# Patient Record
Sex: Male | Born: 1995 | Race: White | Hispanic: No | Marital: Single | Smoking: Never smoker
Health system: Southern US, Community
[De-identification: ages and names within clinical notes are randomized; demographics above are authoritative.]

## PROBLEM LIST (undated history)

## (undated) VITALS — BP 102/66 | HR 77 | Temp 97.1°F | Resp 18 | Wt 154.3 lb

## (undated) DIAGNOSIS — B2790 Infectious mononucleosis, unspecified without complication: Secondary | ICD-10-CM

## (undated) DIAGNOSIS — F419 Anxiety disorder, unspecified: Secondary | ICD-10-CM

## (undated) DIAGNOSIS — S42309A Unspecified fracture of shaft of humerus, unspecified arm, initial encounter for closed fracture: Secondary | ICD-10-CM

## (undated) HISTORY — DX: Infectious mononucleosis, unspecified without complication: B27.90

## (undated) HISTORY — PX: TONSILLECTOMY: SUR1361

## (undated) HISTORY — PX: ADENOIDECTOMY: SUR15

## (undated) HISTORY — DX: Unspecified fracture of shaft of humerus, unspecified arm, initial encounter for closed fracture: S42.309A

## (undated) HISTORY — PX: ORTHOPEDIC SURGERY: SHX850

---
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
## 2016-10-29 ENCOUNTER — Other Ambulatory Visit (INDEPENDENT_AMBULATORY_CARE_PROVIDER_SITE_OTHER): Payer: Self-pay | Admitting: Orthopaedic Surgery

## 2016-10-29 MED ORDER — IBUPROFEN-FAMOTIDINE 800-26.6 MG PO TABS: 1.0000 | ORAL_TABLET | Freq: Three times a day (TID) | ORAL | 3 refills | Status: AC | PRN

## 2016-10-29 MED ORDER — CYCLOBENZAPRINE HCL 5 MG PO TABS: 5.0000 mg | ORAL_TABLET | Freq: Three times a day (TID) | ORAL | 3 refills | Status: AC | PRN

## 2018-11-19 ENCOUNTER — Encounter (HOSPITAL_COMMUNITY): Payer: Self-pay | Admitting: Emergency Medicine

## 2018-11-19 ENCOUNTER — Emergency Department (HOSPITAL_COMMUNITY)
Admission: EM | Admit: 2018-11-19 | Discharge: 2018-11-20 | Disposition: A | Discharge status: Home / Self Care | Payer: BLUE CROSS/BLUE SHIELD | Attending: Emergency Medicine | Admitting: Emergency Medicine

## 2018-11-19 ENCOUNTER — Emergency Department (HOSPITAL_COMMUNITY): Payer: BLUE CROSS/BLUE SHIELD

## 2018-11-19 ENCOUNTER — Other Ambulatory Visit: Payer: Self-pay

## 2018-11-19 DIAGNOSIS — R4182 Altered mental status, unspecified: Secondary | ICD-10-CM | POA: Diagnosis not present

## 2018-11-19 DIAGNOSIS — F333 Major depressive disorder, recurrent, severe with psychotic symptoms: Secondary | ICD-10-CM | POA: Diagnosis not present

## 2018-11-19 DIAGNOSIS — Z23 Encounter for immunization: Secondary | ICD-10-CM | POA: Insufficient documentation

## 2018-11-19 DIAGNOSIS — S91311A Laceration without foreign body, right foot, initial encounter: Secondary | ICD-10-CM | POA: Insufficient documentation

## 2018-11-19 DIAGNOSIS — Y9389 Activity, other specified: Secondary | ICD-10-CM | POA: Insufficient documentation

## 2018-11-19 DIAGNOSIS — F332 Major depressive disorder, recurrent severe without psychotic features: Secondary | ICD-10-CM | POA: Diagnosis not present

## 2018-11-19 DIAGNOSIS — F1920 Other psychoactive substance dependence, uncomplicated: Secondary | ICD-10-CM

## 2018-11-19 DIAGNOSIS — Z79899 Other long term (current) drug therapy: Secondary | ICD-10-CM | POA: Diagnosis not present

## 2018-11-19 DIAGNOSIS — Y92009 Unspecified place in unspecified non-institutional (private) residence as the place of occurrence of the external cause: Secondary | ICD-10-CM | POA: Insufficient documentation

## 2018-11-19 DIAGNOSIS — R45851 Suicidal ideations: Secondary | ICD-10-CM | POA: Diagnosis not present

## 2018-11-19 DIAGNOSIS — F23 Brief psychotic disorder: Secondary | ICD-10-CM

## 2018-11-19 DIAGNOSIS — W25XXXA Contact with sharp glass, initial encounter: Secondary | ICD-10-CM | POA: Insufficient documentation

## 2018-11-19 DIAGNOSIS — F1410 Cocaine abuse, uncomplicated: Secondary | ICD-10-CM

## 2018-11-19 DIAGNOSIS — Y999 Unspecified external cause status: Secondary | ICD-10-CM | POA: Diagnosis not present

## 2018-11-19 HISTORY — DX: Anxiety disorder, unspecified: F41.9

## 2018-11-19 LAB — ACETAMINOPHEN LEVEL: Acetaminophen (Tylenol), Serum: <10 ug/mL — ABNORMAL LOW (ref 10–30)

## 2018-11-19 LAB — CBC
HCT: 42.2 % (ref 39.0–52.0)
HEMOGLOBIN: 14.1 g/dL (ref 13.0–17.0)
MCH: 31.1 pg (ref 26.0–34.0)
MCHC: 33.4 g/dL (ref 30.0–36.0)
MCV: 93 fL (ref 80.0–100.0)
Platelets: 268 10*3/uL (ref 150–400)
RBC: 4.54 MIL/uL (ref 4.22–5.81)
RDW: 13.1 % (ref 11.5–15.5)
WBC: 7.5 10*3/uL (ref 4.0–10.5)
nRBC: 0 % (ref 0.0–0.2)

## 2018-11-19 LAB — COMPREHENSIVE METABOLIC PANEL
ALBUMIN: 4.5 g/dL (ref 3.5–5.0)
ALK PHOS: 55 U/L (ref 38–126)
ALT: 19 U/L (ref 0–44)
AST: 23 U/L (ref 15–41)
Anion gap: 10 (ref 5–15)
BUN: 15 mg/dL (ref 6–20)
CALCIUM: 8.6 mg/dL — AB (ref 8.9–10.3)
CO2: 22 mmol/L (ref 22–32)
CREATININE: 1.06 mg/dL (ref 0.61–1.24)
Chloride: 104 mmol/L (ref 98–111)
GFR calc non Af Amer: >60 mL/min (ref >60)
GLUCOSE: 103 mg/dL — AB (ref 70–99)
Potassium: 3.7 mmol/L (ref 3.5–5.1)
SODIUM: 136 mmol/L (ref 135–145)
Total Bilirubin: 1 mg/dL (ref 0.3–1.2)
Total Protein: 7 g/dL (ref 6.5–8.1)

## 2018-11-19 LAB — RAPID URINE DRUG SCREEN, HOSP PERFORMED
AMPHETAMINES: NOT DETECTED
BENZODIAZEPINES: POSITIVE — AB
Barbiturates: NOT DETECTED
Cocaine: POSITIVE — AB
Opiates: NOT DETECTED
TETRAHYDROCANNABINOL: POSITIVE — AB

## 2018-11-19 LAB — ETHANOL: Alcohol, Ethyl (B): <10 mg/dL (ref <10)

## 2018-11-19 LAB — SALICYLATE LEVEL: Salicylate Lvl: <7 mg/dL (ref 2.8–30.0)

## 2018-11-19 MED ORDER — BUPROPION HCL ER (XL) 150 MG PO TB24
150.0000 mg | ORAL_TABLET | Freq: Every day | ORAL | Status: DC
  Administered 2018-11-19 – 2018-11-20 (×2): 150 mg via ORAL
  Filled 2018-11-19 (×2): qty 1

## 2018-11-19 MED ORDER — PAROXETINE HCL 20 MG PO TABS
40.0000 mg | ORAL_TABLET | ORAL | Status: DC
  Administered 2018-11-20: 40 mg via ORAL
  Filled 2018-11-19: qty 2

## 2018-11-19 MED ORDER — ACETAMINOPHEN 325 MG PO TABS
650.0000 mg | ORAL_TABLET | Freq: Once | ORAL | Status: AC
  Administered 2018-11-19: 650 mg via ORAL
  Filled 2018-11-19: qty 2

## 2018-11-19 MED ORDER — HYDROXYZINE HCL 25 MG PO TABS
25.0000 mg | ORAL_TABLET | Freq: Every day | ORAL | Status: DC
  Administered 2018-11-19 – 2018-11-20 (×2): 25 mg via ORAL
  Filled 2018-11-19 (×2): qty 1

## 2018-11-19 MED ORDER — LORAZEPAM 1 MG PO TABS
1.0000 mg | ORAL_TABLET | Freq: Every day | ORAL | Status: DC
  Administered 2018-11-19 – 2018-11-20 (×2): 1 mg via ORAL
  Filled 2018-11-19 (×2): qty 1

## 2018-11-19 MED ORDER — TETANUS-DIPHTH-ACELL PERTUSSIS 5-2.5-18.5 LF-MCG/0.5 IM SUSP
0.5000 mL | Freq: Once | INTRAMUSCULAR | Status: AC
  Administered 2018-11-19: 0.5 mL via INTRAMUSCULAR
  Filled 2018-11-19 (×2): qty 0.5

## 2018-11-19 NOTE — ED Notes (Signed)
Pts foot wounds cleaned with sterile saline and gauze.

## 2018-11-19 NOTE — ED Notes (Signed)
PT have been made aware of urine sample. Unable to provide at this time 

## 2018-11-19 NOTE — BH Assessment (Addendum)
Assessment Note  Jaime Mann. is an 23 y.o. male that presents this date with S/I after attempting self harm by electrocution. Patient was combative on admission and noted to be impaired. Patient was medicated on arrival as this writer attempted to assess earlier this date unsuccessfully. Patient was assessed later at 1600 hours and is noted to continued to be impaired. Patient is oriented x4 although continues to be very disorganized. Patient is observed to be acting bizarre and answers only certain questions on the assessment. Patient states "I'll pass" on certain questions and renders limited information in reference to the incident that occurred last night. Per notes patient reported on admission that he attempted to put a electrical appliance (hair dryer) in the bathtub with him. When questioned this date patient states "maybe I did, maybe I didn't." Patient denies any previous attempts or gestures at self harm although per note review was seen in 2013 at [facility] for attempting self harm by carbon monoxide poisoning. Patient was diagnosed with MDD per that event. Patient denies any symptoms this date or current medication interventions. Patient renders conflicting information stating he has "never seen a outpatient doctor" and then later states he "has been on medications for years." Patient is observed to be acting very childlike and is drawing pictures in the air with his finger. Patient is speaking in a high voice that is rapid and pressured at times. Patient does not appear to be responding to internal stimuli. Patient denies any SA use although UDS is positive for Cannabis and Cocaine. Patient declines to participate in the latter part of the assessment and is noted to cover his head up in bed with his covers. Information to complete assessment was obtained form history and admission notes. Per notes, patient presents by EMS with suicidal ideation with plan to handle electric appliance while in bath.  Given Haldol 5mg  and Midazolam 5 mg IM for anxiety and combative behavior on arrival. Patient was noted to have kicked out a window at his home. Patient stated on arrival "you fixed my foot, but you can't fix my head." Case was staffed with Arville Care FNP who recommended patient be observed and monitored. Patient will be seen by psychiatry in the a.m.    Diagnosis: F33.3 MDD recurrent with psychotic features, severe, Cannabis use, Cocaine use    Past Medical History:  Past Medical History:  Diagnosis Date  . Anxiety disorder   . Broken arm 2012  . Mononucleosis 5th, 8th and 9th grades    Past Surgical History:  Procedure Laterality Date  . ADENOIDECTOMY    . ORTHOPEDIC SURGERY  Age 60  . TONSILLECTOMY  Age 63    Family History:  Family History  Problem Relation Age of Onset  . Cancer Father   . Depression Father   . Alcohol abuse Paternal Grandmother   . Depression Paternal Grandmother   . Drug abuse Paternal Uncle        recovered     Social History:  reports that he has never smoked. He has never used smokeless tobacco. He reports that he does not drink alcohol or use drugs.  Additional Social History:  Alcohol / Drug Use Pain Medications: See MAR Prescriptions: See MAR Over the Counter: See MAR History of alcohol / drug use?: Yes Longest period of sobriety (when/how long): NA Negative Consequences of Use: (Denies) Withdrawal Symptoms: (Denies) Substance #1 Name of Substance 1: Cocaine per hx 1 - Age of First Use: UTA 1 - Amount (size/oz):  UTA 1 - Frequency: UTA 1 - Duration: UTA 1 - Last Use / Amount: Per UDS this date  CIWA: CIWA-Ar BP: (!) 97/56 Pulse Rate: 70 COWS:    Allergies:  Allergies  Allergen Reactions  . Fish-Derived Products Other (See Comments)    Eye swelling, very bad acid relux  . Shellfish Allergy Nausea And Vomiting and Swelling    Home Medications: (Not in a hospital admission)   OB/GYN Status:  No LMP for male patient.  General  Assessment Data Assessment unable to be completed: Yes Reason for not completing assessment: Pt was medicated on arrival currently asleep  Location of Assessment: [facility] TTS Assessment: In system Is this a Tele or Face-to-Face Assessment?: Face-to-Face Is this an Initial Assessment or a Re-assessment for this encounter?: Initial Assessment Patient Accompanied by:: (NA) Language Other than English: No Living Arrangements: Other (Comment)(Roomates) What gender do you identify as?: Male Marital status: Single Living Arrangements: Non-relatives/Friends Can pt return to current living arrangement?: Yes Admission Status: Voluntary Is patient capable of signing voluntary admission?: Yes Referral Source: Self/Family/Friend Insurance type: BC/BS  Medical Screening Exam [facility] Walk-in ONLY) Medical Exam completed: Yes  Crisis Care Plan Living Arrangements: Non-relatives/Friends Legal Guardian: (NA) Name of Psychiatrist: Pt denies Name of Therapist: Pt denies  Education Status Is patient currently in school?: No Is the patient employed, unemployed or receiving disability?: Unemployed  Risk to self with the past 6 months Suicidal Ideation: Yes-Currently Present Has patient been a risk to self within the past 6 months prior to admission? : No Suicidal Intent: Yes-Currently Present Has patient had any suicidal intent within the past 6 months prior to admission? : No Is patient at risk for suicide?: Yes Suicidal Plan?: Yes-Currently Present Has patient had any suicidal plan within the past 6 months prior to admission? : No Specify Current Suicidal Plan: Put electric dryer in water Access to Means: Yes Specify Access to Suicidal Means: Per notes had dryer What has been your use of drugs/alcohol within the last 12 months?: Current use Previous Attempts/Gestures: Yes How many times?: (Pt states multiple) Other Self Harm Risks: Off medications Triggers for Past Attempts: Unknown Intentional  Self Injurious Behavior: (Pt states "all the time" not specific) Family Suicide History: No Recent stressful life event(s): (UTA) Persecutory voices/beliefs?: No Depression: (Denies) Depression Symptoms: (Denies) Substance abuse history and/or treatment for substance abuse?: No Suicide prevention information given to non-admitted patients: Not applicable  Risk to Others within the past 6 months Homicidal Ideation: No Does patient have any lifetime risk of violence toward others beyond the six months prior to admission? : No Thoughts of Harm to Others: No Current Homicidal Intent: No Current Homicidal Plan: No Access to Homicidal Means: No Identified Victim: NA History of harm to others?: No Assessment of Violence: None Noted Violent Behavior Description: NA Does patient have access to weapons?: No Criminal Charges Pending?: No Does patient have a court date: No Is patient on probation?: No  Psychosis Hallucinations: None noted Delusions: None noted  Mental Status Report Appearance/Hygiene: In scrubs Eye Contact: Fair Motor Activity: Hyperactivity Speech: Pressured, Loud Level of Consciousness: Restless Mood: Anxious Affect: Angry Anxiety Level: Moderate Thought Processes: Thought Blocking Judgement: Impaired Orientation: Person, Place, Time Obsessive Compulsive Thoughts/Behaviors: None  Cognitive Functioning Concentration: Decreased Memory: Recent Impaired Is patient IDD: No Insight: Poor Impulse Control: Poor Appetite: Good Have you had any weight changes? : No Change Sleep: Unable to Assess Total Hours of Sleep: (UTA) Vegetative Symptoms: None  ADLScreening [facility]  Assessment Services) Patient's cognitive ability adequate to safely complete daily activities?: Yes Patient able to express need for assistance with ADLs?: Yes Independently performs ADLs?: Yes (appropriate for developmental age)  Prior Inpatient Therapy Prior Inpatient Therapy: Yes Prior  Therapy Dates: 2013 [facility] Reason for Treatment: MH issues S/I  Prior Outpatient Therapy Prior Outpatient Therapy: Yes Prior Therapy Dates: Ongoing  Prior Therapy Facilty/Provider(s): (Pt states "he doesn't know") Reason for Treatment: Med mang Does patient have an ACCT team?: No Does patient have Intensive In-House Services?  : No Does patient have [facility] services? : No Does patient have P4CC services?: No  ADL Screening (condition at time of admission) Patient's cognitive ability adequate to safely complete daily activities?: Yes Is the patient deaf or have difficulty hearing?: No Does the patient have difficulty seeing, even when wearing glasses/contacts?: No Does the patient have difficulty concentrating, remembering, or making decisions?: No Patient able to express need for assistance with ADLs?: Yes Does the patient have difficulty dressing or bathing?: No Independently performs ADLs?: Yes (appropriate for developmental age) Does the patient have difficulty walking or climbing stairs?: No Weakness of Legs: None Weakness of Arms/Hands: None  Home Assistive Devices/Equipment Home Assistive Devices/Equipment: None  Therapy Consults (therapy consults require a physician order) PT Evaluation Needed: No OT Evalulation Needed: No SLP Evaluation Needed: No Abuse/Neglect Assessment (Assessment to be complete while patient is alone) Physical Abuse: Denies Verbal Abuse: Denies Sexual Abuse: Denies Exploitation of patient/patient's resources: Denies Self-Neglect: Denies Values / Beliefs Cultural Requests During Hospitalization: None Spiritual Requests During Hospitalization: None Consults Spiritual Care Consult Needed: No Social Work Consult Needed: No Merchant navy officerAdvance Directives (For Healthcare) Does Patient Have a Medical Advance Directive?: No Would patient like information on creating a medical advance directive?: No - Patient declined           Disposition: Case was staffed with Arville CareParks FNP who recommended patient be observed and monitored. Patient will be seen by psychiatry in the a.m.    Disposition Initial Assessment Completed for this Encounter: Yes Disposition of Patient: Admit Type of inpatient treatment program: Adult Patient refused recommended treatment: No Mode of transportation if patient is discharged/movement?: Loreli Slot([facility])  On Site Evaluation by:   Reviewed with Physician:    Alfredia Fergusonavid L Nicollette Wilhelmi 11/19/2018 5:26 PM

## 2018-11-19 NOTE — BH Assessment (Signed)
[facility] Assessment Progress Note Case was staffed with Arville Care FNP who recommended patient be observed and monitored. Patient will be seen by psychiatry in the a.m.

## 2018-11-19 NOTE — ED Notes (Signed)
Pt showered. He continues to be pleasant and follow directions.

## 2018-11-19 NOTE — ED Notes (Signed)
Patient transported to X-ray 

## 2018-11-19 NOTE — BH Assessment (Signed)
[facility] Assessment Progress Note This writer attempted to assess at 0900 hours unsuccessfully and again at 1400 hours. Patient continues to sleep. Patient was medicated on arrival earlier this date.

## 2018-11-19 NOTE — ED Triage Notes (Signed)
Suicidal ideation with plan to handle electric appliance while in bath. Given haldol 5mg  iv and midazolm 5 mg IM for anxiety and lose of self control . Pt kicked out window at his home.

## 2018-11-19 NOTE — ED Notes (Signed)
Moved to room 25

## 2018-11-19 NOTE — ED Provider Notes (Signed)
[facility] [place]-EMERGENCY DEPT Provider Note   CSN: [id number] Arrival date & time: 11/19/18  5465    History   Chief Complaint Chief Complaint  Patient presents with   Suicidal   Medical Clearance    HPI Jaime Mann. is a 23 y.o. male.     23 year old male brought in by EMS, history obtained from nursing notes as patient is asleep/medicated by EMS prior to arrival. Per nursing notes: suicidal ideation with plan to handle electric appliance while in bath. Given haldol 5mg  iv and midazolm 5 mg IM for anxiety and lose of self control . Pt kicked out window at his home. 2 lacerations to bottom of right foot.      Past Medical History:  Diagnosis Date   Anxiety disorder    Broken arm 2012   Mononucleosis 5th, 8th and 9th grades    Patient Active Problem List   Diagnosis Date Noted   Depression with suicidal ideation 08/06/2012   Anxiety disorder 08/06/2012   Parent-child relational problem 08/06/2012   Concussion 03/31/2012    Past Surgical History:  Procedure Laterality Date   ADENOIDECTOMY     ORTHOPEDIC SURGERY  Age 12   TONSILLECTOMY  Age 56        Home Medications    Prior to Admission medications   Medication Sig Start Date End Date Taking? Authorizing Provider  buPROPion (WELLBUTRIN XL) 150 MG 24 hr tablet Take 150 mg by mouth daily.   Yes [provider]  hydrOXYzine (ATARAX/VISTARIL) 25 MG tablet Take 25 mg by mouth daily.   Yes [provider]  LORazepam (ATIVAN) 1 MG tablet Take 1 mg by mouth daily.   Yes [provider]  PARoxetine (PAXIL) 40 MG tablet Take 40 mg by mouth every morning.   Yes [provider]  cetirizine (ZYRTEC) 10 MG tablet Take 10 mg by mouth daily.    [provider]  cyclobenzaprine (FLEXERIL) 5 MG tablet Take 1-2 tablets (5-10 mg total) by mouth 3 (three) times daily as needed for muscle spasms. Patient not taking: Reported on 11/19/2018 10/29/16   Tarry Kos, MD  doxycycline (DORYX) 100 MG DR capsule Take 2 capsules (200 mg total) by mouth daily. Patient may resume home supply 08/11/12   Trinda Pascal B, NP  ibuprofen (ADVIL,MOTRIN) 200 MG tablet Take 3 tablets (600 mg total) by mouth every 6 (six) hours as needed. For headache.  Patient may use home supply. 08/11/12   Winson, Louie Bun, NP  Ibuprofen-Famotidine 800-26.6 MG TABS Take 1 tablet by mouth 3 (three) times daily as needed. 10/29/16   Tarry Kos, MD  lamoTRIgine (LAMICTAL) 25 MG tablet Take 100 mg by mouth daily.     [provider]    Family History Family History  Problem Relation Age of Onset   Cancer Father    Depression Father    Alcohol abuse Paternal Grandmother    Depression Paternal Grandmother    Drug abuse Paternal Uncle        recovered     Social History Social History   Tobacco Use   Smoking status: Never Smoker   Smokeless tobacco: Never Used  Substance Use Topics   Alcohol use: No   Drug use: No     Allergies   Fish-derived products and Shellfish allergy   Review of Systems Review of Systems  Unable to perform ROS: Psychiatric disorder     Physical Exam Updated Vital Signs BP Marland Kitchen)  97/56 (BP Location: Left Arm)    Pulse 70    Temp 98 F (36.7 C) (Oral)    Resp 18    SpO2 100%   Physical Exam Vitals signs and nursing note reviewed.  Constitutional:      General: He is sleeping.     Appearance: Normal appearance. He is normal weight.    HENT:     Head: Normocephalic and atraumatic.     Nose: Nose normal.  Eyes:     Conjunctiva/sclera:     Right eye: Right conjunctiva is injected. No exudate.    Left eye: Left conjunctiva is injected. No exudate. Neck:     Musculoskeletal: Neck supple.  Cardiovascular:     Rate and Rhythm: Normal rate and regular rhythm.     Pulses: Normal pulses.     Heart sounds: Normal heart sounds.  Pulmonary:     Effort: Pulmonary effort is normal.     Breath sounds: Normal breath sounds.    Musculoskeletal:       Feet:     Comments: Two lacerations noted to plantar surface of right foot, bleeding controlled.   Skin:    General: Skin is warm and dry.      ED Treatments / Results  Labs (all labs ordered are listed, but only abnormal results are displayed) Labs Reviewed  COMPREHENSIVE METABOLIC PANEL - Abnormal; Notable for the following components:      Result Value   Glucose, Bld 103 (*)    Calcium 8.6 (*)    All other components within normal limits  ACETAMINOPHEN LEVEL - Abnormal; Notable for the following components:   Acetaminophen (Tylenol), Serum <10 (*)    All other components within normal limits  RAPID URINE DRUG SCREEN, HOSP PERFORMED - Abnormal; Notable for the following components:   Cocaine POSITIVE (*)    Benzodiazepines POSITIVE (*)    Tetrahydrocannabinol POSITIVE (*)    All other components within normal limits  ETHANOL  SALICYLATE LEVEL  CBC    EKG None  Radiology Ct Head Wo Contrast  Result Date: 11/19/2018 CLINICAL DATA:  Altered mental status/suicidal ideation EXAM: CT HEAD WITHOUT CONTRAST TECHNIQUE: Contiguous axial images were obtained from the base of the skull through the vertex without intravenous contrast. COMPARISON:  None. FINDINGS: Brain: Ventricles are normal in size and configuration. There is no demonstrable mass, hemorrhage, extra-axial fluid collection, or midline shift. There is decreased attenuation in the medulla, primarily on the left medially but also extending to the medial right medulla. Elsewhere brain parenchyma appears unremarkable. No findings felt to represent acute infarct evident. Vascular: No hyperdense vessel. No vascular calcifications are appreciable. Skull: The bony calvarium appears intact. Sinuses/Orbits: There are retention cysts in each maxillary antrum, larger on the right than on the left. Mucosal thickening is noted in each maxillary antrum. There is opacification in multiple ethmoid air cells. There is  mild mucosal thickening in the anterior sphenoid sinus regions. Orbits appear symmetric bilaterally. Other: Mastoid air cells are clear. IMPRESSION: 1. Decreased attenuation in the medullary region, slightly more on the left than on the right. This finding potentially may be artifactual. Question clinical findings suggesting potential vascular disease in the lower medulla. Elsewhere brain parenchyma appears unremarkable. No findings felt to represent acute infarct. No mass or hemorrhage evident. 2.  Multifocal paranasal sinus disease. Electronically Signed   By: Bretta Bang III M.D.   On: 11/19/2018 09:52   Dg Foot Complete Right  Result Date: 11/19/2018 CLINICAL DATA:  Laceration following extension of foot through glass window EXAM: RIGHT FOOT COMPLETE - 3+ VIEW COMPARISON:  None. FINDINGS: Frontal, oblique, and lateral views were obtained. There are apparent lacerations along the volar aspect of the forefoot. No radiopaque foreign bodies are demonstrable. No appreciable fracture or dislocation. Joint spaces appear normal. No erosive change. IMPRESSION: Lacerations along the volar aspect of the forefoot. No radiopaque foreign body. No fracture or dislocation. No evident arthropathy. Electronically Signed   By: Bretta Bang III M.D.   On: 11/19/2018 07:34    Procedures .Marland KitchenLaceration Repair Date/Time: 11/19/2018 9:17 AM Performed by: Jeannie Fend, PA-C Authorized by: Jeannie Fend, PA-C   Consent:    Consent obtained:  Verbal   Consent given by:  Patient   Risks discussed:  Infection, need for additional repair, pain, poor cosmetic result and poor wound healing   Alternatives discussed:  No treatment and delayed treatment Universal protocol:    Procedure explained and questions answered to patient or proxy's satisfaction: yes     Relevant documents present and verified: yes     Test results available and properly labeled: yes     Imaging studies available: yes     Required blood  products, implants, devices, and special equipment available: yes     Site/side marked: yes     Immediately prior to procedure, a time out was called: yes     Patient identity confirmed:  Verbally with patient Anesthesia (see MAR for exact dosages):    Anesthesia method:  None Laceration details:    Location:  Foot   Foot location:  Sole of R foot   Length (cm):  3   Depth (mm):  5 Repair type:    Repair type:  Simple Pre-procedure details:    Preparation:  Patient was prepped and draped in usual sterile fashion and imaging obtained to evaluate for foreign bodies Exploration:    Wound exploration: wound explored through full range of motion and entire depth of wound probed and visualized     Wound extent: no foreign bodies/material noted and no muscle damage noted     Contaminated: no   Treatment:    Area cleansed with:  Betadine and saline   Amount of cleaning:  Extensive   Irrigation solution:  Sterile saline Skin repair:    Repair method:  Steri-Strips   Number of Steri-Strips:  5 Approximation:    Approximation:  Loose Post-procedure details:    Dressing:  Open (no dressing)   Patient tolerance of procedure:  Tolerated well, no immediate complications .Marland KitchenLaceration Repair Date/Time: 11/19/2018 9:18 AM Performed by: Jeannie Fend, PA-C Authorized by: Jeannie Fend, PA-C   Consent:    Consent obtained:  Verbal   Consent given by:  Patient   Risks discussed:  Infection, need for additional repair, pain, poor cosmetic result and poor wound healing   Alternatives discussed:  No treatment and delayed treatment Universal protocol:    Procedure explained and questions answered to patient or proxy's satisfaction: yes     Relevant documents present and verified: yes     Test results available and properly labeled: yes     Imaging studies available: yes     Required blood products, implants, devices, and special equipment available: yes     Site/side marked: yes      Immediately prior to procedure, a time out was called: yes     Patient identity confirmed:  Verbally with patient Anesthesia (see MAR for exact dosages):  Anesthesia method:  None Laceration details:    Location:  Foot   Foot location:  Sole of R foot   Length (cm):  2.5   Depth (mm):  4 Repair type:    Repair type:  Simple Pre-procedure details:    Preparation:  Patient was prepped and draped in usual sterile fashion and imaging obtained to evaluate for foreign bodies Exploration:    Wound exploration: wound explored through full range of motion and entire depth of wound probed and visualized     Wound extent: no foreign bodies/material noted and no muscle damage noted     Contaminated: no   Treatment:    Area cleansed with:  Betadine and saline   Amount of cleaning:  Extensive   Irrigation solution:  Sterile saline Skin repair:    Repair method:  Steri-Strips   Number of Steri-Strips:  2 Approximation:    Approximation:  Loose Post-procedure details:    Dressing:  Open (no dressing)   Patient tolerance of procedure:  Tolerated well, no immediate complications   (including critical care time)  Medications Ordered in ED Medications  buPROPion (WELLBUTRIN XL) 24 hr tablet 150 mg (has no administration in time range)  hydrOXYzine (ATARAX/VISTARIL) tablet 25 mg (has no administration in time range)  LORazepam (ATIVAN) tablet 1 mg (has no administration in time range)  PARoxetine (PAXIL) tablet 40 mg (has no administration in time range)  Tdap (BOOSTRIX) injection 0.5 mL (0.5 mLs Intramuscular Given 11/19/18 0704)  acetaminophen (TYLENOL) tablet 650 mg (650 mg Oral Given 11/19/18 1149)     Initial Impression / Assessment and Plan / ED Course  I have reviewed the triage vital signs and the nursing notes.  Pertinent labs & imaging results that were available during my care of the patient were reviewed by me and considered in my medical decision making (see chart for  details).  Clinical Course as of Nov 19 1502  Fri Nov 19, 2018  0806 22yo male brought in by EMS for SI, wound to the bottom of the right foot. On initial assessment, patient was sleeping (given haldol and midazolam by EMS). XR negative for FB, CBC, CMP, aspirin, tylenol and alcohol levels all unremarkable. UDS pending. On recheck, patient is awake to verbal stimuli, states he lives "wherever anyone will take him in," has not thoughts of harming himself or others, however reports feeling hopeless, states no one will help him- only the ER, maybe.  With patient's permission, contact with mom- Ademola Vert, [phone], mom reports medication lis, is unaware of any events leading up to patient's presentation in the ER today, mom is currently out of town but is return to [employer] today. Advised mom of the current no visitor policy at the hospital due to COVID concerns.     [LM]  [id number] Plan is to clean foot wounds, dress with steristrips, non-weightbearing, eval by behavioral health.    [LM]  [phone] Call from patient's mother, received a text from someone who was with the patient last night, reports he was "bashing his head into a wall multiple times." CT head ordered for further evaluation.    [LM]  0914 Wound cleaned and dressed, during wound cleaning, patient states "so you know dirty kids? I was born dirty." Goes on to explain he dropped out of school and has to remove the clot from his foot wound.    [LM]  1433 Patient was transferred to behavioral health unit pending behavioral health evaluation.  IVC paperwork prepared however  not submitted as patient is compliant with plan of care so far.   [LM]    Clinical Course User Index [LM] Jeannie Fend, PA-C     Final Clinical Impressions(s) / ED Diagnoses   Final diagnoses:  Acute psychosis (HCC)  Cocaine abuse [facility])    ED Discharge Orders    None       Alden Hipp 11/19/18 1504    Benjiman Core, MD 11/19/18 1510

## 2018-11-19 NOTE — ED Notes (Signed)
Pt is wanting to leave.  He stated, "I need you to leave so I can leave."  "you fixed my foot, but you can't fix my head."

## 2018-11-19 NOTE — ED Notes (Signed)
Pt sleeping. 

## 2018-11-19 NOTE — ED Notes (Signed)
Continue sleeping

## 2018-11-19 NOTE — ED Notes (Signed)
Report received pt currently sleeping admits SI / HI security called to wand and transfer to room TCU 29

## 2018-11-19 NOTE — ED Notes (Signed)
Resting wants to sleep. Offered meal, but refused will eat later.

## 2018-11-19 NOTE — ED Notes (Signed)
Continues to sleep.

## 2018-11-19 NOTE — ED Notes (Signed)
Bed: WA29 Expected date:  Expected time:  Means of arrival:  Comments: 

## 2018-11-19 NOTE — ED Notes (Signed)
CN requesting to transfer pt to ED RM 25. Call give Matty report.

## 2018-11-19 NOTE — ED Notes (Signed)
Moving pt's property, property given to [place], Charity fundraiser.

## 2018-11-19 NOTE — ED Notes (Signed)
Bed: VU13 Expected date:  Expected time:  Means of arrival:  Comments: Hold for 31

## 2018-11-19 NOTE — ED Notes (Addendum)
This Clinical research associate asked about IVC paperwork, since pt is a flight risk, and pt is altered.

## 2018-11-19 NOTE — ED Triage Notes (Signed)
Patient sustained 2 laceration on his right foot after he kicked the window.

## 2018-11-19 NOTE — ED Notes (Signed)
Pt's mother called for updates on pt's status.

## 2018-11-19 NOTE — ED Notes (Signed)
Bed: WA23 Expected date:  Expected time:  Means of arrival:  Comments: hold 

## 2018-11-20 DIAGNOSIS — F1920 Other psychoactive substance dependence, uncomplicated: Secondary | ICD-10-CM

## 2018-11-20 DIAGNOSIS — F332 Major depressive disorder, recurrent severe without psychotic features: Secondary | ICD-10-CM

## 2018-11-20 MED ORDER — LORAZEPAM 1 MG PO TABS: 1.0000 mg | ORAL_TABLET | Freq: Four times a day (QID) | ORAL | Status: DC | PRN

## 2018-11-20 MED ORDER — GABAPENTIN 100 MG PO CAPS: 200.0000 mg | ORAL_CAPSULE | Freq: Two times a day (BID) | ORAL | Status: DC

## 2018-11-20 MED ORDER — GABAPENTIN 100 MG PO CAPS
200.0000 mg | ORAL_CAPSULE | Freq: Two times a day (BID) | ORAL | Status: DC
  Administered 2018-11-20: 200 mg via ORAL
  Filled 2018-11-20: qty 2

## 2018-11-20 NOTE — ED Notes (Signed)
Patient pooped all over the bed and stated he was sweaty, patient was sent to shower.

## 2018-11-20 NOTE — Consult Note (Signed)
[facility] Face-to-Face Psychiatry Consult   Reason for Consult: ''suicidal ideation with to electrocute himself.'' Referring Physician:  EDP Patient Identification: Jaime Mann. MRN:  [id number] Principal Diagnosis: Major depressive disorder, recurrent episode, severe (HCC) Diagnosis:  Principal Problem:   Major depressive disorder, recurrent episode, severe (HCC) Active Problems:   Polysubstance (excluding opioids) dependence with physiological dependence (HCC)   Total Time spent with patient: 45 minutes  Subjective:   Jaime Mann. is a 23 y.o. male patient admitted with suicidal ideation with plan.  HPI:  Patient with history of Major depression, polysubstance dependence who presents with suicidal ideation with plan to electrocute himself. Patient reports recurrent depressive symptoms and has been self medicating with multiple illicit drugs. Patient reports that he lives in [place] with his parents, but he came to [place] few days ago to [employer] two of his friends who needed his help. Patient reports that he has been struggling with Depression and substance abuse since age 36, has been to multiple drug rehabilitation facilities in the past. Today, patient states that his parents has arranged for him to be admitted to an hospital in [place] and will probably will be going there today. He is unable to contract for safety.  Past Psychiatric History: as above  Risk to Self: Suicidal Ideation: Yes-Currently Present Suicidal Intent: Yes-Currently Present Is patient at risk for suicide?: Yes Suicidal Plan?: Yes-Currently Present Specify Current Suicidal Plan: Put electric dryer in water Access to Means: Yes Specify Access to Suicidal Means: Per notes had dryer What has been your use of drugs/alcohol within the last 12 months?: Current use How many times?: (Pt states multiple) Other Self Harm Risks: Off medications Triggers for Past Attempts: Unknown Intentional Self Injurious  Behavior: (Pt states "all the time" not specific) Risk to Others: Homicidal Ideation: No Thoughts of Harm to Others: No Current Homicidal Intent: No Current Homicidal Plan: No Access to Homicidal Means: No Identified Victim: NA History of harm to others?: No Assessment of Violence: None Noted Violent Behavior Description: NA Does patient have access to weapons?: No Criminal Charges Pending?: No Does patient have a court date: No Prior Inpatient Therapy: Prior Inpatient Therapy: Yes Prior Therapy Dates: 2013 Prior Therapy Facilty/Provider(s): [facility] Reason for Treatment: MH issues S/I Prior Outpatient Therapy: Prior Outpatient Therapy: Yes Prior Therapy Dates: Ongoing  Prior Therapy Facilty/Provider(s): (Pt states "he doesn't know") Reason for Treatment: Med mang Does patient have an ACCT team?: No Does patient have Intensive In-House Services?  : No Does patient have [facility] services? : No Does patient have P4CC services?: No  Past Medical History:  Past Medical History:  Diagnosis Date  . Anxiety disorder   . Broken arm 2012  . Mononucleosis 5th, 8th and 9th grades    Past Surgical History:  Procedure Laterality Date  . ADENOIDECTOMY    . ORTHOPEDIC SURGERY  Age 68  . TONSILLECTOMY  Age 41   Family History:  Family History  Problem Relation Age of Onset  . Cancer Father   . Depression Father   . Alcohol abuse Paternal Grandmother   . Depression Paternal Grandmother   . Drug abuse Paternal Uncle        recovered    Family Psychiatric  History:  Social History:  Social History   Substance and Sexual Activity  Alcohol Use No     Social History   Substance and Sexual Activity  Drug Use No    Social History   Socioeconomic History  . Marital status: Single  Spouse name: Not on file  . Number of children: Not on file  . Years of education: Not on file  . Highest education level: Not on file  Occupational History  . Occupation: Consulting civil engineertudent    Comment: 11th  grade at [employer]  Social Needs  . Financial resource strain: Not on file  . Food insecurity:    Worry: Not on file    Inability: Not on file  . Transportation needs:    Medical: Not on file    Non-medical: Not on file  Tobacco Use  . Smoking status: Never Smoker  . Smokeless tobacco: Never Used  Substance and Sexual Activity  . Alcohol use: No  . Drug use: No  . Sexual activity: Never  Lifestyle  . Physical activity:    Days per week: Not on file    Minutes per session: Not on file  . Stress: Not on file  Relationships  . Social connections:    Talks on phone: Not on file    Gets together: Not on file    Attends religious service: Not on file    Active member of club or organization: Not on file    Attends meetings of clubs or organizations: Not on file    Relationship status: Not on file  Other Topics Concern  . Not on file  Social History Narrative  . Not on file   Additional Social History:    Allergies:   Allergies  Allergen Reactions  . Fish-Derived Products Other (See Comments)    Eye swelling, very bad acid relux  . Shellfish Allergy Nausea And Vomiting and Swelling    Labs:  Results for orders placed or performed during the hospital encounter of 11/19/18 (from the past 48 hour(s))  Comprehensive metabolic panel     Status: Abnormal   Collection Time: 11/19/18  6:41 AM  Result Value Ref Range   Sodium 136 135 - 145 mmol/L   Potassium 3.7 3.5 - 5.1 mmol/L   Chloride 104 98 - 111 mmol/L   CO2 22 22 - 32 mmol/L   Glucose, Bld 103 (H) 70 - 99 mg/dL   BUN 15 6 - 20 mg/dL   Creatinine, Ser 1.611.06 0.61 - 1.24 mg/dL   Calcium 8.6 (L) 8.9 - 10.3 mg/dL   Total Protein 7.0 6.5 - 8.1 g/dL   Albumin 4.5 3.5 - 5.0 g/dL   AST 23 15 - 41 U/L   ALT 19 0 - 44 U/L   Alkaline Phosphatase 55 38 - 126 U/L   Total Bilirubin 1.0 0.3 - 1.2 mg/dL   GFR calc non Af Amer >60 >60 mL/min   GFR calc Af Amer >60 >60 mL/min   Anion gap 10 5 - 15    Comment: Performed at  [facility], [address] [place], [place] [address]  Ethanol     Status: None   Collection Time: 11/19/18  6:41 AM  Result Value Ref Range   Alcohol, Ethyl (B) <10 <10 mg/dL    Comment: (NOTE) Lowest detectable limit for serum alcohol is 10 mg/dL. For medical purposes only. Performed at [facility], [address] [place], [place] [address]   Salicylate level     Status: None   Collection Time: 11/19/18  6:41 AM  Result Value Ref Range   Salicylate Lvl <7.0 2.8 - 30.0 mg/dL    Comment: Performed at [facility], [address] [place], [place] [address]  Acetaminophen  level     Status: Abnormal   Collection Time: 11/19/18  6:41 AM  Result Value Ref Range   Acetaminophen (Tylenol), Serum <10 (L) 10 - 30 ug/mL    Comment: (NOTE) Therapeutic concentrations vary significantly. A range of 10-30 ug/mL  may be an effective concentration for many patients. However, some  are best treated at concentrations outside of this range. Acetaminophen concentrations >150 ug/mL at 4 hours after ingestion  and >50 ug/mL at 12 hours after ingestion are often associated with  toxic reactions. Performed at [facility], [address], [place], [place] [address]   cbc     Status: None   Collection Time: 11/19/18  6:41 AM  Result Value Ref Range   WBC 7.5 4.0 - 10.5 K/uL   RBC 4.54 4.22 - 5.81 MIL/uL   Hemoglobin 14.1 13.0 - 17.0 g/dL   HCT 67.0 14.1 - 03.0 %   MCV 93.0 80.0 - 100.0 fL   MCH 31.1 26.0 - 34.0 pg   MCHC 33.4 30.0 - 36.0 g/dL   RDW 13.1 43.8 - 88.7 %   Platelets 268 150 - 400 K/uL   nRBC 0.0 0.0 - 0.2 %    Comment: Performed at [facility], [address], [place], [place] [address]  Rapid urine drug screen (hospital performed)     Status: Abnormal   Collection Time: 11/19/18  6:41 AM  Result Value Ref Range   Opiates NONE DETECTED NONE DETECTED   Cocaine POSITIVE (A) NONE  DETECTED   Benzodiazepines POSITIVE (A) NONE DETECTED   Amphetamines NONE DETECTED NONE DETECTED   Tetrahydrocannabinol POSITIVE (A) NONE DETECTED   Barbiturates NONE DETECTED NONE DETECTED    Comment: (NOTE) DRUG SCREEN FOR MEDICAL PURPOSES ONLY.  IF CONFIRMATION IS NEEDED FOR ANY PURPOSE, NOTIFY LAB WITHIN 5 DAYS. LOWEST DETECTABLE LIMITS FOR URINE DRUG SCREEN Drug Class                     Cutoff (ng/mL) Amphetamine and metabolites    1000 Barbiturate and metabolites    200 Benzodiazepine                 200 Tricyclics and metabolites     300 Opiates and metabolites        300 Cocaine and metabolites        300 THC                            50 Performed at [facility], [address] [place], [place] [address]     Current Facility-Administered Medications  Medication Dose Route Frequency Provider Last Rate Last Dose  . buPROPion (WELLBUTRIN XL) 24 hr tablet 150 mg  150 mg Oral Daily Army Melia A, PA-C   150 mg at 11/20/18 1561  . gabapentin (NEURONTIN) capsule 200 mg  200 mg Oral BID Shaneese Tait, MD      . hydrOXYzine (ATARAX/VISTARIL) tablet 25 mg  25 mg Oral Daily Army Melia A, PA-C   25 mg at 11/20/18 0914  . LORazepam (ATIVAN) tablet 1 mg  1 mg Oral Q6H PRN Cariann Kinnamon, MD      . PARoxetine (PAXIL) tablet 40 mg  40 mg Oral BH-q7a Murphy, Laura A, PA-C   40 mg at 11/20/18 5379   Current Outpatient Medications  Medication Sig Dispense Refill  . buPROPion (WELLBUTRIN XL) 150 MG 24 hr tablet Take  150 mg by mouth daily.    . hydrOXYzine (ATARAX/VISTARIL) 25 MG tablet Take 25 mg by mouth daily.    Marland Kitchen LORazepam (ATIVAN) 1 MG tablet Take 1 mg by mouth daily.    Marland Kitchen PARoxetine (PAXIL) 40 MG tablet Take 40 mg by mouth every morning.    . cetirizine (ZYRTEC) 10 MG tablet Take 10 mg by mouth daily.    . cyclobenzaprine (FLEXERIL) 5 MG tablet Take 1-2 tablets (5-10 mg total) by mouth 3 (three) times daily as needed for muscle spasms. (Patient not  taking: Reported on 11/19/2018) 30 tablet 3  . doxycycline (DORYX) 100 MG DR capsule Take 2 capsules (200 mg total) by mouth daily. Patient may resume home supply    . ibuprofen (ADVIL,MOTRIN) 200 MG tablet Take 3 tablets (600 mg total) by mouth every 6 (six) hours as needed. For headache.  Patient may use home supply.    . Ibuprofen-Famotidine 800-26.6 MG TABS Take 1 tablet by mouth 3 (three) times daily as needed. 30 tablet 3  . lamoTRIgine (LAMICTAL) 25 MG tablet Take 100 mg by mouth daily.       Musculoskeletal: Strength & Muscle Tone: within normal limits Gait & Station: normal Patient leans: Right  Psychiatric Specialty Exam: Physical Exam  Psychiatric: His speech is normal and behavior is normal. Cognition and memory are normal. He expresses impulsivity. He exhibits a depressed mood. He expresses suicidal ideation. He expresses suicidal plans.    Review of Systems  Constitutional: Negative.   HENT: Negative.   Eyes: Negative.   Respiratory: Negative.   Cardiovascular: Negative.   Gastrointestinal: Negative.   Genitourinary: Negative.   Musculoskeletal: Negative.   Skin: Negative.   Neurological: Negative.   Endo/Heme/Allergies: Negative.   Psychiatric/Behavioral: Positive for depression, substance abuse and suicidal ideas.    Blood pressure (!) 147/72, pulse 89, temperature 98.2 F (36.8 C), temperature source Oral, resp. rate 18, SpO2 100 %.There is no height or weight on file to calculate BMI.  General Appearance: Casual  Eye Contact:  Good  Speech:  Clear and Coherent  Volume:  Decreased  Mood:  Dysphoric  Affect:  Constricted  Thought Process:  Coherent and Linear  Orientation:  Full (Time, Place, and Person)  Thought Content:  Logical  Suicidal Thoughts:  Yes.  with intent/plan  Homicidal Thoughts:  No  Memory:  Immediate;   Good Recent;   Good Remote;   Good  Judgement:  Poor  Insight:  Shallow  Psychomotor Activity:  Psychomotor Retardation   Concentration:  Concentration: Good and Attention Span: Good  Recall:  Good  Fund of Knowledge:  Good  Language:  Good  Akathisia:  No  Handed:  Right  AIMS (if indicated):     Assets:  Communication Skills Desire for Improvement Social Support  ADL's:  Intact  Cognition:  WNL  Sleep:   fair     Treatment Plan Summary: Daily contact with patient to assess and evaluate symptoms and progress in treatment and Medication management  -Continue Paxil, Bupropion and Hydroxyzine -Add Gabapentin 200 mg BID for agitation, cocaine withdrawal  Disposition: Recommend psychiatric Inpatient admission when medically cleared. Supportive therapy provided about ongoing stressors.  Thedore Mins, MD 11/20/2018 11:42 AM

## 2018-11-20 NOTE — Progress Notes (Addendum)
Patient ID: Jaime Busing., male   DOB: [birth year], 23 y.o.   MRN: [id number]  Pt's mother arrived to transport Pt to [place] facility for inpatient treatment. Pt denies suicidal and homicidal ideation. Pt is released to his mother's care. Pt's mother was provided with a form to have the Pt's records released to the facility he is going to in [place] for rehab. Pt is psychiatrically clear to discharge home.Jaime Abbe, FNP-C 11/20/2018      1327 Patient seen face-to-face for psychiatric evaluation, chart reviewed and case discussed with the physician extender and developed treatment plan. Reviewed the information documented and agree with the treatment plan. Thedore Mins, MD

## 2020-07-31 IMAGING — CR RIGHT FOOT COMPLETE - 3+ VIEW
3 series · 3 of 3 positions shown · non-contrast
Comparison: None.

CLINICAL DATA: Laceration following extension of foot through glass
window

EXAM:
RIGHT FOOT COMPLETE - 3+ VIEW

[x foot ap right]
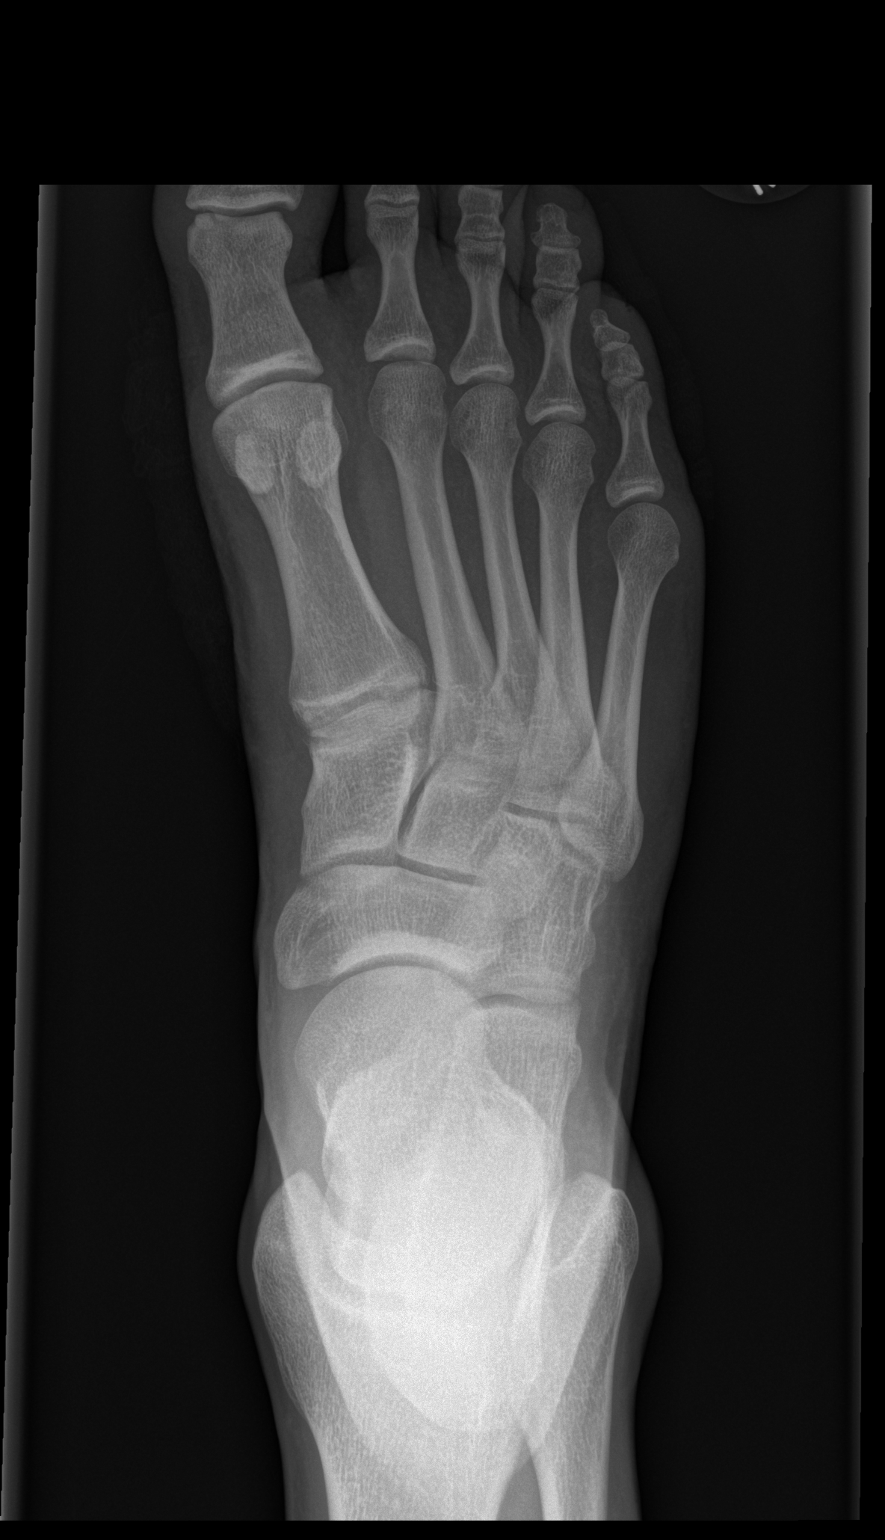

[x foot obl right]
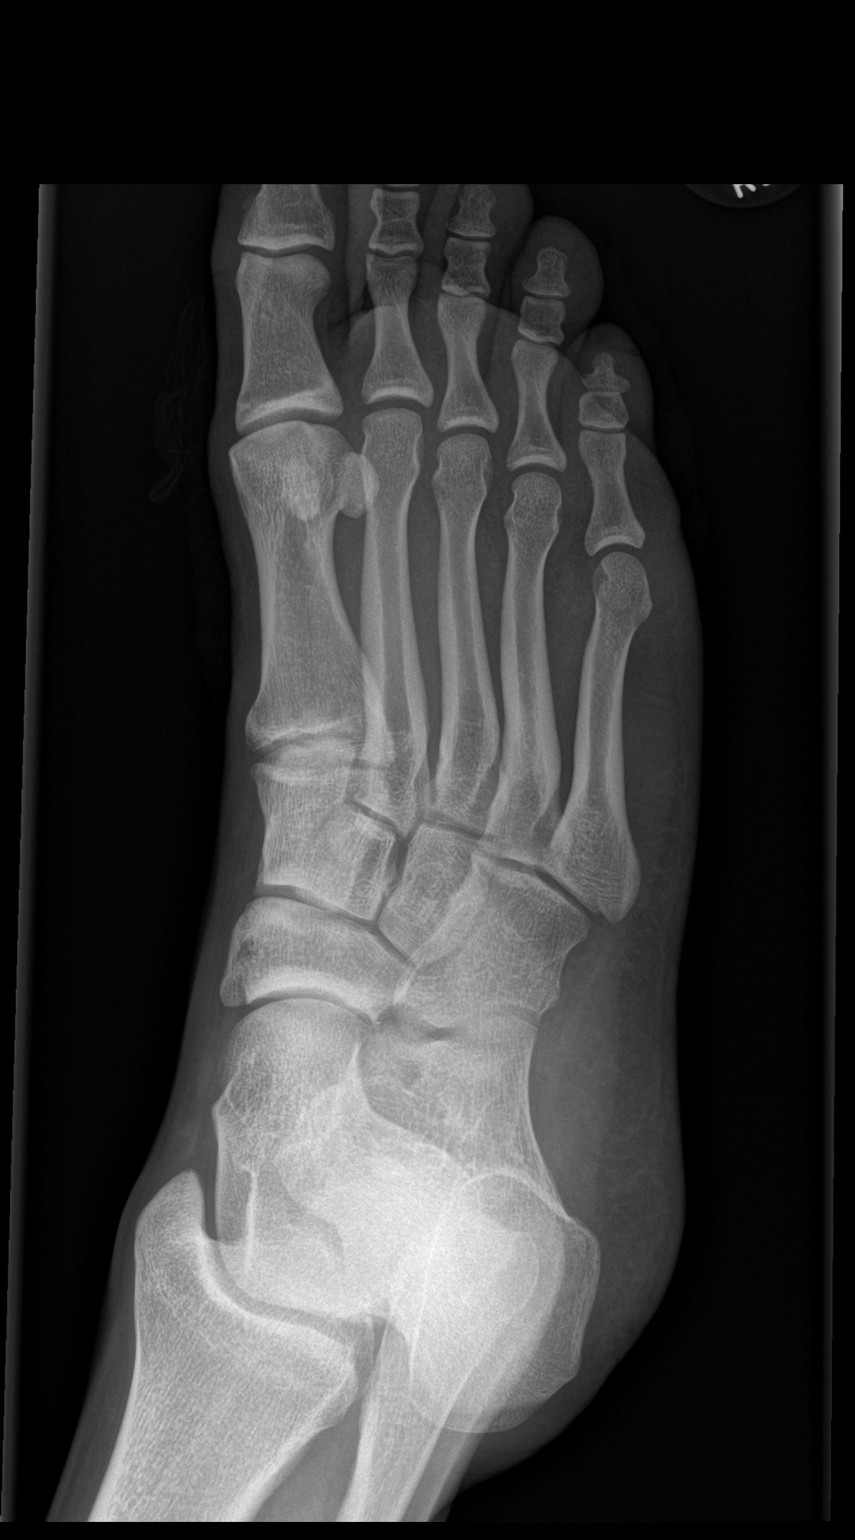

[x foot lat right]
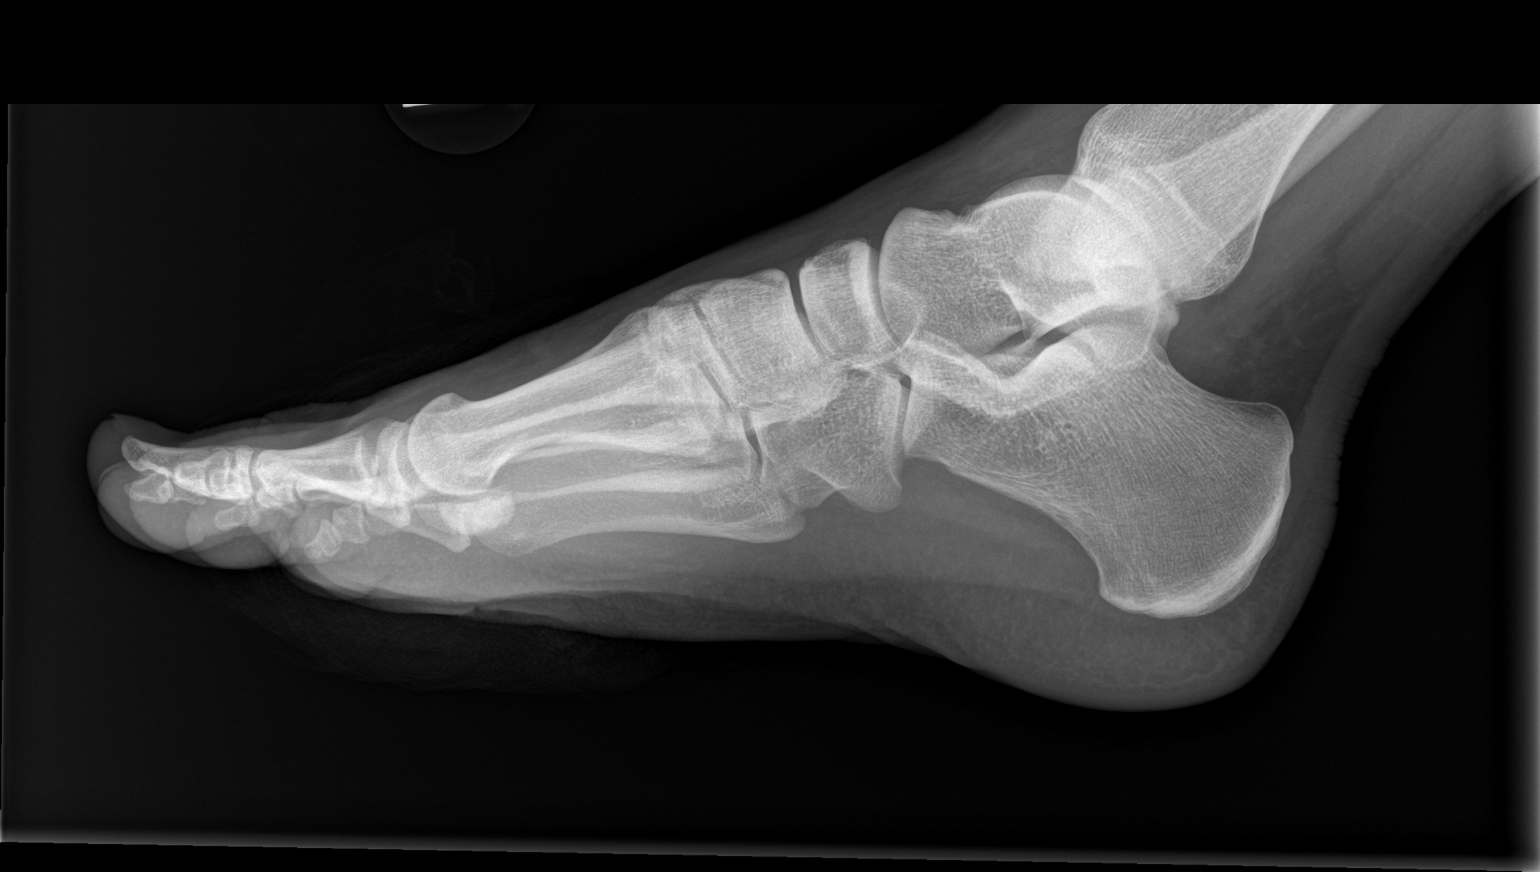

[3 of 3 positions shown; findings below may reference images not displayed]

FINDINGS: Frontal, oblique, and lateral views were obtained. There are
apparent lacerations along the volar aspect of the forefoot. No
radiopaque foreign bodies are demonstrable. No appreciable fracture
or dislocation. Joint spaces appear normal. No erosive change.
IMPRESSION: Lacerations along the volar aspect of the forefoot. No radiopaque
foreign body. No fracture or dislocation. No evident arthropathy.
